# Patient Record
Sex: Male | Born: 1995
Health system: Southern US, Community
[De-identification: ages and names within clinical notes are randomized; demographics above are authoritative.]

## PROBLEM LIST (undated history)

## (undated) ENCOUNTER — Emergency Department (HOSPITAL_COMMUNITY): Discharge status: Absent without leave | Payer: Self-pay

## (undated) DIAGNOSIS — J309 Allergic rhinitis, unspecified: Secondary | ICD-10-CM

## (undated) HISTORY — PX: NO PAST SURGERIES: SHX2092

## (undated) HISTORY — DX: Allergic rhinitis, unspecified: J30.9

---
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
## 2019-06-18 DIAGNOSIS — Z23 Encounter for immunization: Secondary | ICD-10-CM | POA: Diagnosis not present

## 2020-01-10 DIAGNOSIS — Z20828 Contact with and (suspected) exposure to other viral communicable diseases: Secondary | ICD-10-CM | POA: Diagnosis not present

## 2020-01-14 DIAGNOSIS — J069 Acute upper respiratory infection, unspecified: Secondary | ICD-10-CM | POA: Diagnosis not present

## 2020-03-24 DIAGNOSIS — Z20822 Contact with and (suspected) exposure to covid-19: Secondary | ICD-10-CM | POA: Diagnosis not present

## 2020-09-13 DIAGNOSIS — S61411A Laceration without foreign body of right hand, initial encounter: Secondary | ICD-10-CM | POA: Diagnosis not present

## 2020-09-13 DIAGNOSIS — W25XXXA Contact with sharp glass, initial encounter: Secondary | ICD-10-CM | POA: Diagnosis not present

## 2020-09-29 ENCOUNTER — Telehealth: Payer: Self-pay

## 2020-09-29 DIAGNOSIS — Z4802 Encounter for removal of sutures: Secondary | ICD-10-CM | POA: Diagnosis not present

## 2020-09-29 NOTE — Telephone Encounter (Signed)
Caller Adren Dollins  Call Back @ [phone]  Patient is requesting to re-establish care with you.   Please Advise

## 2020-09-29 NOTE — Telephone Encounter (Signed)
Yes, thank you.

## 2020-10-06 ENCOUNTER — Other Ambulatory Visit: Payer: Self-pay

## 2020-10-06 ENCOUNTER — Encounter: Payer: Self-pay | Admitting: Internal Medicine

## 2020-10-06 ENCOUNTER — Ambulatory Visit (INDEPENDENT_AMBULATORY_CARE_PROVIDER_SITE_OTHER): Payer: BC Managed Care – PPO | Admitting: Internal Medicine

## 2020-10-06 VITALS — BP 132/76 | HR 80 | Temp 97.8°F | Resp 16 | Ht 67.0 in | Wt 156.0 lb

## 2020-10-06 DIAGNOSIS — Z1159 Encounter for screening for other viral diseases: Secondary | ICD-10-CM | POA: Diagnosis not present

## 2020-10-06 DIAGNOSIS — Z114 Encounter for screening for human immunodeficiency virus [HIV]: Secondary | ICD-10-CM | POA: Diagnosis not present

## 2020-10-06 DIAGNOSIS — Z0000 Encounter for general adult medical examination without abnormal findings: Secondary | ICD-10-CM | POA: Diagnosis not present

## 2020-10-06 NOTE — Progress Notes (Signed)
° °  Subjective:    Patient ID: Tommy Wilson, male    DOB: 1995/09/16, 25 y.o.   MRN: [id number]  DOS:  10/06/2020 Type of visit - description: New patient, CPX Last seen was many years ago. Since then he is doing well. He did report a few weeks ago, he was drinking beer (multiple beers) on a empty stomach. He felt somewhat dizzy, got up and lost consciousness for about 10 seconds. Did fall on the floor, the next day he had some neck pain but no headache.  Neck pain is largely resolved. Currently feeling great and has no symptoms.    Review of Systems  Other than above, a 14 point review of systems is negative      Past Medical History:  Diagnosis Date   Allergic rhinitis     Past Surgical History:  Procedure Laterality Date   NO PAST SURGERIES      Allergies as of 10/06/2020      Reactions   Penicillins       Medication List    as of October 06, 2020 11:59 PM   You have not been prescribed any medications.        Objective:   Physical Exam BP 132/76 (BP Location: Left Arm, Patient Position: Sitting, Cuff Size: Small)    Pulse 80    Temp 97.8 F (36.6 C) (Oral)    Resp 16    Ht 5\' 7"  (1.702 m)    Wt 156 lb (70.8 kg)    SpO2 97%    BMI 24.43 kg/m  General: Well developed, NAD, BMI noted Neck: No  thyromegaly  HEENT:  Normocephalic . Face symmetric, atraumatic Lungs:  CTA B Normal respiratory effort, no intercostal retractions, no accessory muscle use. Heart: RRR,  no murmur.  Abdomen:  Not distended, soft, non-tender. No rebound or rigidity.   Lower extremities: no pretibial edema bilaterally  Skin: Exposed areas without rash. Not pale. Not jaundice Neurologic:  alert & oriented X3.  Speech normal, gait appropriate for age and unassisted Strength symmetric and appropriate for age.  Psych: Cognition and judgment appear intact.  Cooperative with normal attention span and concentration.  Behavior appropriate. No anxious or depressed appearing.      Assessment    Assessment: Healthy  Plan: Here to reestablish, CPX. BP noted to be elevated upon arrival: Recheck 132/76, recommend to check every 3 months. RTC 1 year      This visit occurred during the SARS-CoV-2 public health emergency.  Safety protocols were in place, including screening questions prior to the visit, additional usage of staff PPE, and extensive cleaning of exam room while observing appropriate contact time as indicated for disinfecting solutions.

## 2020-10-06 NOTE — Patient Instructions (Addendum)
Check the  blood pressure every 3 months BP GOAL is between 110/65 and  135/85. If it is consistently higher or lower, let me know   Please bring all the immunization records that you have  GO TO THE LAB : Get the blood work     GO TO THE FRONT DESK, PLEASE SCHEDULE YOUR APPOINTMENTS Come back for another physical exam in 1 year   Safe Sex Practicing safe sex means taking steps before and during sex to reduce your risk of:  Getting an STI (sexually transmitted infection).  Giving your partner an STI.  Unwanted or unplanned pregnancy. How to practice safe sex Ways you can practice safe sex  Limit your sexual partners to only one partner who is having sex with only you.  Avoid using alcohol and drugs before having sex. Alcohol and drugs can affect your judgment.  Before having sex with a new partner: ? Talk to your partner about past partners, past STIs, and drug use. ? Get screened for STIs and discuss the results with your partner. Ask your partner to get screened too.  Check your body regularly for sores, blisters, rashes, or unusual discharge. If you notice any of these problems, visit your health care provider.  Avoid sexual contact if you have symptoms of an infection or you are being treated for an STI.  While having sex, use a condom. Make sure to: ? Use a condom every time you have vaginal, oral, or anal sex. Both females and males should wear condoms during oral sex. ? Keep condoms in place from the beginning to the end of sexual activity. ? Use a latex condom, if possible. Latex condoms offer the best protection. ? Use only water-based lubricants with a condom. Using petroleum-based lubricants or oils will weaken the condom and increase the chance that it will break.   Ways your health care provider can help you practice safe sex  See your health care provider for regular screenings, exams, and tests for STIs.  Talk with your health care provider about what kind  of birth control (contraception) is best for you.  Get vaccinated against hepatitis B and human papillomavirus (HPV).  If you are at risk of being infected with HIV (human immunodeficiency virus), talk with your health care provider about taking a prescription medicine to prevent HIV infection. You are at risk for HIV if you: ? Are a man who has sex with other men. ? Are sexually active with more than one partner. ? Take drugs by injection. ? Have a sex partner who has HIV. ? Have unprotected sex. ? Have sex with someone who has sex with both men and women. ? Have had an STI.   Follow these instructions at home:  Take over-the-counter and prescription medicines only as told by your health care provider.  Keep all follow-up visits. This is important. Where to find more information  Centers for Disease Control and Prevention: FootballExhibition.com.br  Planned Parenthood: www.plannedparenthood.org  Office on [employer] Health: http://hoffman.com/ Summary  Practicing safe sex means taking steps before and during sex to reduce your risk getting an STI, giving your partner an STI, and having an unwanted or unplanned pregnancy.  Before having sex with a new partner, talk to your partner about past partners, past STIs, and drug use.  Use a condom every time you have vaginal, oral, or anal sex. Both females and males should wear condoms during oral sex.  Check your body regularly for sores, blisters, rashes, or  unusual discharge. If you notice any of these problems, visit your health care provider.  See your health care provider for regular screenings, exams, and tests for STIs. This information is not intended to replace advice given to you by your health care provider. Make sure you discuss any questions you have with your health care provider. Document Revised: 12/31/2019 Document Reviewed: 12/31/2019 Elsevier Patient Education  2021 Elsevier Inc.  Testicular Self-Exam A self-examination of your  testicles (testicular self-exam) involves looking at and feeling your testicles for abnormal lumps or swelling. Several things can cause swelling, lumps, or pain in your testicles. Some of these causes are:  Injuries.  Inflammation.  Infection.  Buildup of fluids around the testicle (hydrocele).  Twisted testicles (testicular torsion).  Testicular cancer. You may be at risk for testicular cancer if you have: ? An undescended testicle (cryptorchidism). ? A history of previous testicular cancer. ? A family history of testicular cancer. General tips and recommendations  The testicles are easiest to examine after a warm bath or shower. They are more difficult to examine when you are cold because the muscles attached to the testicles retract and pull them up higher or into the abdomen.  A normal testicle is egg-shaped and feels firm. It is smooth and not tender.  It is normal to feel a firm, spaghetti-like cord at the back of your testicle. This is the spermatic cord. How to do a testicular self-exam 1. Stand and hold your penis away from your body. 2. Look at each testicle to check for changes in appearance, such as swelling or changes in size or shape. 3. Roll each testicle between your thumb and forefinger, feeling the entire testicle. Feel for: ? Lumps. ? Swelling. ? Discomfort. 4. Check the groin area between your abdomen and upper thighs on both sides of your body. Look and feel for any swelling or bumps that are tender. These could be enlarged lymph nodes.   Contact a health care provider if:  You find any bumps or lumps, such as a small, hard, pea-sized lump.  You find swelling, pain, or soreness.  You see or feel any other changes in your testicles. Summary  A self-examination of your testicles (testicular self-exam) involves looking at and feeling your testicles for any changes.  Check each of your testicles for lumps, swelling, or discomfort. These changes can be  caused by many things.  Check for swelling or tender bumps in your groin area between your lower abdomen and upper thighs. This information is not intended to replace advice given to you by your health care provider. Make sure you discuss any questions you have with your health care provider. Document Revised: 07/02/2019 Document Reviewed: 07/02/2019 Elsevier Patient Education  2021 [employer].

## 2020-10-06 NOTE — Progress Notes (Signed)
Pre visit review using our clinic review tool, if applicable. No additional management support is needed unless otherwise documented below in the visit note. 

## 2020-10-07 ENCOUNTER — Encounter: Payer: Self-pay | Admitting: Internal Medicine

## 2020-10-07 LAB — CBC WITH DIFFERENTIAL/PLATELET
Basophils Absolute: 0 10*3/uL (ref 0.0–0.1)
Basophils Relative: 0.4 % (ref 0.0–3.0)
Eosinophils Absolute: 0.3 10*3/uL (ref 0.0–0.7)
Eosinophils Relative: 3.7 % (ref 0.0–5.0)
HCT: 41.1 % (ref 39.0–52.0)
Hemoglobin: 14.3 g/dL (ref 13.0–17.0)
Lymphocytes Relative: 34.4 % (ref 12.0–46.0)
Lymphs Abs: 2.4 10*3/uL (ref 0.7–4.0)
MCHC: 34.8 g/dL (ref 30.0–36.0)
MCV: 89.4 fl (ref 78.0–100.0)
Monocytes Absolute: 0.5 10*3/uL (ref 0.1–1.0)
Monocytes Relative: 6.6 % (ref 3.0–12.0)
Neutro Abs: 3.9 10*3/uL (ref 1.4–7.7)
Neutrophils Relative %: 54.9 % (ref 43.0–77.0)
Platelets: 164 10*3/uL (ref 150.0–400.0)
RBC: 4.6 Mil/uL (ref 4.22–5.81)
RDW: 13.3 % (ref 11.5–15.5)
WBC: 7.1 10*3/uL (ref 4.0–10.5)

## 2020-10-07 LAB — LIPID PANEL
Cholesterol: 145 mg/dL (ref 0–200)
HDL: 51.3 mg/dL (ref >39.00)
LDL Cholesterol: 54 mg/dL (ref 0–99)
NonHDL: 94.09
Total CHOL/HDL Ratio: 3
Triglycerides: 200 mg/dL — ABNORMAL HIGH (ref 0.0–149.0)
VLDL: 40 mg/dL (ref 0.0–40.0)

## 2020-10-07 LAB — COMPREHENSIVE METABOLIC PANEL
ALT: 17 U/L (ref 0–53)
AST: 19 U/L (ref 0–37)
Albumin: 4.5 g/dL (ref 3.5–5.2)
Alkaline Phosphatase: 85 U/L (ref 39–117)
BUN: 18 mg/dL (ref 6–23)
CO2: 29 mEq/L (ref 19–32)
Calcium: 9.4 mg/dL (ref 8.4–10.5)
Chloride: 104 mEq/L (ref 96–112)
Creatinine, Ser: 0.78 mg/dL (ref 0.40–1.50)
GFR: 124.58 mL/min (ref >60.00)
Glucose, Bld: 88 mg/dL (ref 70–99)
Potassium: 3.9 mEq/L (ref 3.5–5.1)
Sodium: 139 mEq/L (ref 135–145)
Total Bilirubin: 0.6 mg/dL (ref 0.2–1.2)
Total Protein: 7.3 g/dL (ref 6.0–8.3)

## 2020-10-07 LAB — TSH: TSH: 0.79 u[IU]/mL (ref 0.35–4.50)

## 2020-10-07 LAB — HIV ANTIBODY (ROUTINE TESTING W REFLEX): HIV 1&2 Ab, 4th Generation: NONREACTIVE

## 2020-10-07 LAB — HEPATITIS C ANTIBODY
Hepatitis C Ab: NONREACTIVE
SIGNAL TO CUT-OFF: 0.01 (ref <1.00)

## 2020-10-07 NOTE — Assessment & Plan Note (Signed)
Vaccines: Moved to the [place] at age 25 months, he got all  childhood shots (per patient), also had a number of shots last year (related to a immigration process).  Asked to bring records.  Reports she had a flu shot, encouraged to get his COVID vaccines. Labs: CMP, FLP, CBC, TSH, HIV, hep C Patient education: -Diet exercise -Safe sex -Self testicular exam -Risk of binge drinking, strongly encouraged moderation.

## 2020-10-14 ENCOUNTER — Encounter: Payer: Self-pay | Admitting: Internal Medicine

## 2020-10-14 ENCOUNTER — Other Ambulatory Visit: Payer: Self-pay

## 2020-10-14 ENCOUNTER — Observation Stay (HOSPITAL_COMMUNITY)
Admission: EM | Admit: 2020-10-14 | Discharge: 2020-10-15 | Disposition: A | Discharge status: Home / Self Care | Payer: BC Managed Care – PPO | Attending: Otolaryngology | Admitting: Otolaryngology

## 2020-10-14 ENCOUNTER — Telehealth (INDEPENDENT_AMBULATORY_CARE_PROVIDER_SITE_OTHER): Payer: BC Managed Care – PPO | Admitting: Internal Medicine

## 2020-10-14 ENCOUNTER — Emergency Department (HOSPITAL_COMMUNITY): Payer: BC Managed Care – PPO

## 2020-10-14 VITALS — Ht 67.0 in | Wt 156.0 lb

## 2020-10-14 DIAGNOSIS — Z20822 Contact with and (suspected) exposure to covid-19: Secondary | ICD-10-CM | POA: Insufficient documentation

## 2020-10-14 DIAGNOSIS — J029 Acute pharyngitis, unspecified: Secondary | ICD-10-CM | POA: Diagnosis not present

## 2020-10-14 DIAGNOSIS — J36 Peritonsillar abscess: Secondary | ICD-10-CM | POA: Diagnosis not present

## 2020-10-14 DIAGNOSIS — R590 Localized enlarged lymph nodes: Secondary | ICD-10-CM | POA: Diagnosis not present

## 2020-10-14 DIAGNOSIS — F17210 Nicotine dependence, cigarettes, uncomplicated: Secondary | ICD-10-CM | POA: Diagnosis not present

## 2020-10-14 DIAGNOSIS — R1310 Dysphagia, unspecified: Secondary | ICD-10-CM | POA: Diagnosis not present

## 2020-10-14 DIAGNOSIS — J392 Other diseases of pharynx: Secondary | ICD-10-CM | POA: Diagnosis not present

## 2020-10-14 LAB — CBC WITH DIFFERENTIAL/PLATELET
Abs Immature Granulocytes: 0.06 10*3/uL (ref 0.00–0.07)
Basophils Absolute: 0 10*3/uL (ref 0.0–0.1)
Basophils Relative: 0 %
Eosinophils Absolute: 0.1 10*3/uL (ref 0.0–0.5)
Eosinophils Relative: 0 %
HCT: 43.8 % (ref 39.0–52.0)
Hemoglobin: 15.1 g/dL (ref 13.0–17.0)
Immature Granulocytes: 0 %
Lymphocytes Relative: 10 %
Lymphs Abs: 1.5 10*3/uL (ref 0.7–4.0)
MCH: 30.9 pg (ref 26.0–34.0)
MCHC: 34.5 g/dL (ref 30.0–36.0)
MCV: 89.8 fL (ref 80.0–100.0)
Monocytes Absolute: 1.2 10*3/uL — ABNORMAL HIGH (ref 0.1–1.0)
Monocytes Relative: 8 %
Neutro Abs: 12.1 10*3/uL — ABNORMAL HIGH (ref 1.7–7.7)
Neutrophils Relative %: 82 %
Platelets: 144 10*3/uL — ABNORMAL LOW (ref 150–400)
RBC: 4.88 MIL/uL (ref 4.22–5.81)
RDW: 13 % (ref 11.5–15.5)
WBC: 15 10*3/uL — ABNORMAL HIGH (ref 4.0–10.5)
nRBC: 0 % (ref 0.0–0.2)

## 2020-10-14 LAB — GROUP A STREP BY PCR: Group A Strep by PCR: NOT DETECTED

## 2020-10-14 LAB — I-STAT CHEM 8, ED
BUN: 10 mg/dL (ref 6–20)
Calcium, Ion: 1.14 mmol/L — ABNORMAL LOW (ref 1.15–1.40)
Chloride: 105 mmol/L (ref 98–111)
Creatinine, Ser: 0.6 mg/dL — ABNORMAL LOW (ref 0.61–1.24)
Glucose, Bld: 96 mg/dL (ref 70–99)
HCT: 44 % (ref 39.0–52.0)
Hemoglobin: 15 g/dL (ref 13.0–17.0)
Potassium: 3.8 mmol/L (ref 3.5–5.1)
Sodium: 139 mmol/L (ref 135–145)
TCO2: 22 mmol/L (ref 22–32)

## 2020-10-14 MED ORDER — SODIUM CHLORIDE 0.9 % IV BOLUS
1000.0000 mL | Freq: Once | INTRAVENOUS | Status: AC
  Administered 2020-10-14: 1000 mL via INTRAVENOUS

## 2020-10-14 MED ORDER — IOHEXOL 300 MG/ML  SOLN
75.0000 mL | Freq: Once | INTRAMUSCULAR | Status: AC | PRN
  Administered 2020-10-14: 75 mL via INTRAVENOUS

## 2020-10-14 MED ORDER — LIDOCAINE HCL (PF) 1 % IJ SOLN
10.0000 mL | Freq: Once | INTRAMUSCULAR | Status: DC
  Filled 2020-10-14: qty 10

## 2020-10-14 MED ORDER — KETOROLAC TROMETHAMINE 30 MG/ML IJ SOLN
30.0000 mg | Freq: Once | INTRAMUSCULAR | Status: AC
  Administered 2020-10-14: 30 mg via INTRAVENOUS
  Filled 2020-10-14: qty 1

## 2020-10-14 MED ORDER — CLINDAMYCIN PHOSPHATE 600 MG/50ML IV SOLN
600.0000 mg | Freq: Once | INTRAVENOUS | Status: AC
  Administered 2020-10-14: 600 mg via INTRAVENOUS
  Filled 2020-10-14: qty 50

## 2020-10-14 MED ORDER — DEXAMETHASONE SODIUM PHOSPHATE 10 MG/ML IJ SOLN
10.0000 mg | Freq: Once | INTRAMUSCULAR | Status: AC
  Administered 2020-10-14: 10 mg via INTRAVENOUS
  Filled 2020-10-14: qty 1

## 2020-10-14 NOTE — ED Triage Notes (Signed)
Patient c/o sore throat and difficulty swallowing. Stated he has an appt tomorrow for wisdom tooth removal x4 tomorrow and the pain started from there.

## 2020-10-14 NOTE — Discharge Instructions (Addendum)
You can take Tylenol or Ibuprofen as directed for pain. You can alternate Tylenol and Ibuprofen every 4 hours. If you take Tylenol at 1pm, then you can take Ibuprofen at 5pm. Then you can take Tylenol again at 9pm.   Take pain medications as directed for break through pain. Do not drive or operate machinery while taking this medication.   Take antibiotics clindamycin 300 mg 3 times a day for the 10 days as directed. Please take all of your antibiotics until finished.  Follow-up with referred ear nose and throat doctor within 7 to 10 days sooner if any worsening of symptoms..  Return emergency department for any worsening pain, inability to swallow, difficulty breathing, vomiting or any other worsening concerning symptoms.

## 2020-10-14 NOTE — ED Notes (Signed)
ENT Provider at bedside 

## 2020-10-14 NOTE — Patient Instructions (Signed)
Go to the ER at [facility] now I already spoke with the doctor there

## 2020-10-14 NOTE — ED Notes (Signed)
Patient has pain with swallowing but is able to swallow.

## 2020-10-14 NOTE — Consult Note (Signed)
Reason for Consult: Peritonsillar abscess Referring Physician: ER  Tommy Wilson is an 25 y.o. male.  HPI: 4-day history of sore throat.  He has had intermittent left molar pain.  Many times when he has had the pain in his molar the throat has given him some difficulty.  He was due to get his wisdom teeth out.  His symptoms this time with his throat started on Saturday and he became more swollen in his submandibular area and pain to swallow.  He went to the dentist on Monday and got some clindamycin and started that Monday afternoon.  He is somewhat better.  His CT scan showed a abscess cavity in the inferior portion of the tonsil or soft tissue just superior and lateral to the epiglottis.  This appears to have edematous changes in the retropharynx and it does extend to the medial aspect of the left mandible.  He is fairly convinced this is a tooth problem.  Past Medical History:  Diagnosis Date  . Allergic rhinitis     Past Surgical History:  Procedure Laterality Date  . NO PAST SURGERIES      Family History  Problem Relation Age of Onset  . Hypertension Father   . CAD Neg Hx   . Stroke Neg Hx   . Diabetes Neg Hx   . Cancer Neg Hx     Social History:  reports that he has been smoking e-cigarettes. He has never used smokeless tobacco. He reports current alcohol use. He reports that he does not use drugs.  Allergies:  Allergies  Allergen Reactions  . Penicillins     Medications: I have reviewed the patient's current medications.  Results for orders placed or performed during the hospital encounter of 10/14/20 (from the past 48 hour(s))  Group A Strep by PCR     Status: None   Collection Time: 10/14/20  6:22 PM   Specimen: Throat; Sterile Swab  Result Value Ref Range   Group A Strep by PCR NOT DETECTED NOT DETECTED    Comment: Performed at [facility], [address], [place], [place] [address]  CBC with Differential     Status: Abnormal   Collection Time: 10/14/20   6:28 PM  Result Value Ref Range   WBC 15.0 (H) 4.0 - 10.5 K/uL   RBC 4.88 4.22 - 5.81 MIL/uL   Hemoglobin 15.1 13.0 - 17.0 g/dL   HCT 93.2 67.1 - 24.5 %   MCV 89.8 80.0 - 100.0 fL   MCH 30.9 26.0 - 34.0 pg   MCHC 34.5 30.0 - 36.0 g/dL   RDW 80.9 98.3 - 38.2 %   Platelets 144 (L) 150 - 400 K/uL   nRBC 0.0 0.0 - 0.2 %   Neutrophils Relative % 82 %   Neutro Abs 12.1 (H) 1.7 - 7.7 K/uL   Lymphocytes Relative 10 %   Lymphs Abs 1.5 0.7 - 4.0 K/uL   Monocytes Relative 8 %   Monocytes Absolute 1.2 (H) 0.1 - 1.0 K/uL   Eosinophils Relative 0 %   Eosinophils Absolute 0.1 0.0 - 0.5 K/uL   Basophils Relative 0 %   Basophils Absolute 0.0 0.0 - 0.1 K/uL   Immature Granulocytes 0 %   Abs Immature Granulocytes 0.06 0.00 - 0.07 K/uL    Comment: Performed at [facility], [address], [place], [place] [address]  I-stat chem 8, ED (not at [facility] or [facility])     Status: Abnormal   Collection Time:  10/14/20  6:59 PM  Result Value Ref Range   Sodium 139 135 - 145 mmol/L   Potassium 3.8 3.5 - 5.1 mmol/L   Chloride 105 98 - 111 mmol/L   BUN 10 6 - 20 mg/dL   Creatinine, Ser 1.85 (L) 0.61 - 1.24 mg/dL   Glucose, Bld 96 70 - 99 mg/dL    Comment: Glucose reference range applies only to samples taken after fasting for at least 8 hours.   Calcium, Ion 1.14 (L) 1.15 - 1.40 mmol/L   TCO2 22 22 - 32 mmol/L   Hemoglobin 15.0 13.0 - 17.0 g/dL   HCT 63.1 49.7 - 02.6 %    CT Soft Tissue Neck W Contrast  Result Date: 10/14/2020 CLINICAL DATA:  Difficulty swallowing with sore throat EXAM: CT NECK WITH CONTRAST TECHNIQUE: Multidetector CT imaging of the neck was performed using the standard protocol following the bolus administration of intravenous contrast. CONTRAST:  67mL OMNIPAQUE IOHEXOL 300 MG/ML  SOLN COMPARISON:  None. FINDINGS: PHARYNX AND LARYNX: There is a left palatine peritonsillar abscess measuring 1.4 x 0.9 cm. There is a large retropharyngeal collection measuring 5 mm in thickness and extending  from C1-C5. The collection tapers superiorly and inferiorly and is asymmetric to the right SALIVARY GLANDS: Normal parotid, submandibular and sublingual glands. THYROID: Normal. LYMPH NODES: Multiple bilateral subcentimeter lymph nodes. There is an enlarged left level 2A node that measures 12 mm. VASCULAR: Major cervical vessels are patent. LIMITED INTRACRANIAL: Normal. VISUALIZED ORBITS: Normal. MASTOIDS AND VISUALIZED PARANASAL SINUSES: No fluid levels or advanced mucosal thickening. No mastoid effusion. SKELETON: No bony spinal canal stenosis. No lytic or blastic lesions. UPPER CHEST: Clear. OTHER: None. IMPRESSION: 1. Left palatine peritonsillar abscess measuring 1.4 x 0.9 cm with retropharyngeal edema. 2. Reactive cervical lymphadenopathy. Electronically Signed   By: Deatra Robinson M.D.   On: 10/14/2020 21:29    ROS Blood pressure 126/74, pulse 92, temperature 99.3 F (37.4 C), resp. rate 17, SpO2 97 %. Physical Exam Constitutional:      Appearance: He is well-developed.  HENT:     Head:     Comments: He is without any distress and normal voice.  Opens his mouth normally.  He does not have any evidence of drooling or difficulty swallowing.  His nose is clear.  Oral cavity/oropharynx-the dentition do not have any apparent irritation or infection and palpably along the medial mandible there is no swelling or pain.  The tonsil on the left side is slightly larger than the right but does not have the appearance of a peritonsillar abscess.  He sticks his tongue out normally.  I cannot appreciate the swelling of the retropharynx. Musculoskeletal:     Cervical back: Normal range of motion and neck supple.  Neurological:     Mental Status: He is alert.       Assessment/Plan: Left pharyngeal abscess-this could be a low peritonsillar abscess but it is almost outside the region it so low.  Without the CAT scan his exam would not indicate he had any issue relative to a abscess.  This possibly could be a  dental origin as there is some edematous tracking from the medial mandible but for now he has a collection of fluid that is not amendable to in the emergency room drainage and I think with the size of this a medical therapy would be appropriate.  I am going to start him on intravenous antibiotics and admission.  We will judge whether he is improving enough to not  need a surgical intervention.  He says he is allergic to penicillin but he does not know what the reaction was Suzanna Obey 10/14/2020, 11:44 PM

## 2020-10-14 NOTE — ED Provider Notes (Signed)
[facility] EMERGENCY DEPARTMENT Provider Note   CSN: [id number] Arrival date & time: 10/14/20  1716     History Chief Complaint  Patient presents with  . Sore Throat    Tommy Wilson is a 25 y.o. male past medical history of allergic rhinitis who presents for evaluation of sore throat that is been ongoing for 2 days.  He states that he is scheduled to get his wisdom teeth taken out tomorrow and he thought that is what was causing his symptoms.  He is on clindamycin which she has been taking.  He states that the pain in his throat was getting worse, prompting a virtual visit with his PCP.  His PCP sent him to the ED for further evaluation.  He states that it hurts more when he swallows but he is able to tolerate his secretions, liquids.  He has not tried to eat any solids.  He has not had any difficulty breathing, vomiting.  He has not noted any fever.  He states that he is supposed to get his vision be taken out tomorrow and that is why he has been on the clindamycin.  The history is provided by the patient.       Past Medical History:  Diagnosis Date  . Allergic rhinitis     Patient Active Problem List   Diagnosis Date Noted  . Tonsil, abscess 10/14/2020  . Annual physical exam 11/28/2013  . ABNORMAL THYROID FUNCTION TESTS 11/21/2009  . ALLERGY, ENVIRONMENTAL 11/21/2009    Past Surgical History:  Procedure Laterality Date  . NO PAST SURGERIES         Family History  Problem Relation Age of Onset  . Hypertension Father   . CAD Neg Hx   . Stroke Neg Hx   . Diabetes Neg Hx   . Cancer Neg Hx     Social History   Tobacco Use  . Smoking status: Current Every Day Smoker    Types: E-cigarettes  . Smokeless tobacco: Never Used  Substance Use Topics  . Alcohol use: Yes    Comment: weekends   . Drug use: No    Home Medications Prior to Admission medications   Medication Sig Start Date End Date Taking? Authorizing Provider  chlorhexidine  (PERIDEX) 0.12 % solution by Mouth Rinse route. 10/13/20  Yes [provider]  clindamycin (CLEOCIN) 150 MG capsule Take 150 mg by mouth See admin instructions. Qid x 7 days 10/13/20  Yes [provider]  ibuprofen (ADVIL) 600 MG tablet Take 600 mg by mouth 3 (three) times daily. 10/13/20  Yes [provider]    Allergies    Penicillins  Review of Systems   Review of Systems  HENT: Positive for sore throat and trouble swallowing.   Respiratory: Negative for shortness of breath.   Gastrointestinal: Negative for vomiting.  All other systems reviewed and are negative.   Physical Exam Updated Vital Signs BP 126/74   Pulse 92   Temp 99.3 F (37.4 C)   Resp 17   SpO2 97%   Physical Exam Vitals and nursing note reviewed.  Constitutional:      Appearance: He is well-developed and well-nourished.  HENT:     Head: Normocephalic and atraumatic.     Mouth/Throat:     Comments: Slightly horse phonation.  Posterior pharynx is erythematous.  There is some slight edema noted to the left tonsillar area extending distally.  Uvula is very minimally deviated.  No swelling in floor of  mouth.  No oral angioedema.  Eyes:     General: No scleral icterus.       Right eye: No discharge.        Left eye: No discharge.     Extraocular Movements: EOM normal.     Conjunctiva/sclera: Conjunctivae normal.  Pulmonary:     Effort: Pulmonary effort is normal.     Comments: Lungs clear to auscultation bilaterally.  Symmetric chest rise.  No wheezing, rales, rhonchi. Lymphadenopathy:     Cervical: Cervical adenopathy present.  Skin:    General: Skin is warm and dry.  Neurological:     Mental Status: He is alert.  Psychiatric:        Mood and Affect: Mood and affect normal.        Speech: Speech normal.        Behavior: Behavior normal.     ED Results / Procedures / Treatments   Labs (all labs ordered are listed, but only abnormal results are displayed) Labs Reviewed  CBC  WITH DIFFERENTIAL/PLATELET - Abnormal; Notable for the following components:      Result Value   WBC 15.0 (*)    Platelets 144 (*)    Neutro Abs 12.1 (*)    Monocytes Absolute 1.2 (*)    All other components within normal limits  I-STAT CHEM 8, ED - Abnormal; Notable for the following components:   Creatinine, Ser 0.60 (*)    Calcium, Ion 1.14 (*)    All other components within normal limits  GROUP A STREP BY PCR  POC SARS CORONAVIRUS 2 AG -  ED    EKG None  Radiology CT Soft Tissue Neck W Contrast  Result Date: 10/14/2020 CLINICAL DATA:  Difficulty swallowing with sore throat EXAM: CT NECK WITH CONTRAST TECHNIQUE: Multidetector CT imaging of the neck was performed using the standard protocol following the bolus administration of intravenous contrast. CONTRAST:  40mL OMNIPAQUE IOHEXOL 300 MG/ML  SOLN COMPARISON:  None. FINDINGS: PHARYNX AND LARYNX: There is a left palatine peritonsillar abscess measuring 1.4 x 0.9 cm. There is a large retropharyngeal collection measuring 5 mm in thickness and extending from C1-C5. The collection tapers superiorly and inferiorly and is asymmetric to the right SALIVARY GLANDS: Normal parotid, submandibular and sublingual glands. THYROID: Normal. LYMPH NODES: Multiple bilateral subcentimeter lymph nodes. There is an enlarged left level 2A node that measures 12 mm. VASCULAR: Major cervical vessels are patent. LIMITED INTRACRANIAL: Normal. VISUALIZED ORBITS: Normal. MASTOIDS AND VISUALIZED PARANASAL SINUSES: No fluid levels or advanced mucosal thickening. No mastoid effusion. SKELETON: No bony spinal canal stenosis. No lytic or blastic lesions. UPPER CHEST: Clear. OTHER: None. IMPRESSION: 1. Left palatine peritonsillar abscess measuring 1.4 x 0.9 cm with retropharyngeal edema. 2. Reactive cervical lymphadenopathy. Electronically Signed   By: Deatra Robinson M.D.   On: 10/14/2020 21:29    Procedures Procedures   Medications Ordered in ED Medications  lidocaine  (PF) (XYLOCAINE) 1 % injection 10 mL (has no administration in time range)  sodium chloride 0.9 % bolus 1,000 mL (0 mLs Intravenous Stopped 10/14/20 2034)  dexamethasone (DECADRON) injection 10 mg (10 mg Intravenous Given 10/14/20 1847)  ketorolac (TORADOL) 30 MG/ML injection 30 mg (30 mg Intravenous Given 10/14/20 1848)  iohexol (OMNIPAQUE) 300 MG/ML solution 75 mL (75 mLs Intravenous Contrast Given 10/14/20 2111)  clindamycin (CLEOCIN) IVPB 600 mg (0 mg Intravenous Stopped 10/14/20 2331)    ED Course  I have reviewed the triage vital signs and the nursing notes.  Pertinent labs &  imaging results that were available during my care of the patient were reviewed by me and considered in my medical decision making (see chart for details).    MDM Rules/Calculators/A&P                          25 year old male who presents for evaluation of 2 to 3 days of sore throat.  Reports painful swallowing.  He has been able tolerate his secretions but does report pain with doing so.  He states he is on clindamycin because he is scheduled to get his wisdom teeth taken out tomorrow and the risk and some concerns about some possible infection.  On initial arrival, he is afebrile nontoxic-appearing.  Vital signs are stable.  On exam, he does have some hoarse phonation.  Airways patent.  Uvula slightly deviated and he has some asymmetrical tonsillar swelling and on the left.  He does have some cervical lymphadenopathy.  We will plan to check labs, strep.  CBC shows leukocytosis of 15.  Chem-8 is unremarkable.  Strep is negative.  CT soft tissue neck shows evidence of peritonsillar abscess on the left measuring 1.4 x 1 cm with retropharyngeal edema.  There is some reactive some cervical lymphadenopathy.  Reassessment.  Patient does report some improvement in pain after analgesics here in the ED.  He states he feels like he is able to swallow better.  Discussed with Dr. Jearld Fenton (ENT).  I did discuss with him regarding the  retropharyngeal edema as well as the fact the patient has already been on clindamycin and this occurred on antibiotics.  Will discuss with patient for further management.  Patient states he would rather have ENT come and drain the abscess tonight.  I notified Dr. Jearld Fenton and he is agreeable and will come in.  Discussed patient with Dr. Jearld Fenton (ENT) after evaluation in the ED. He will plan to admit the patient.   Portions of this note were generated with Scientist, clinical (histocompatibility and immunogenetics). Dictation errors may occur despite best attempts at proofreading.   Final Clinical Impression(s) / ED Diagnoses Final diagnoses:  Peritonsillar abscess    Rx / DC Orders ED Discharge Orders    None       Rosana Hoes 10/14/20 2359    Tilden Fossa, MD 10/15/20 1437

## 2020-10-14 NOTE — Progress Notes (Signed)
° °  Subjective:    Patient ID: Tommy Wilson, male    DOB: [birth year], 25 y.o.   MRN: [id number]  DOS:  10/14/2020 Type of visit - description: Virtual visit, converted to in person visit  Symptoms started approximately 3 to 4 days ago with sore throat but at the same time dental pain. Was seen emergently yesterday by a dentist, was prescribed clindamycin and ibuprofen, is scheduled to have wisdom tooth surgery tomorrow at 8 AM.  he is calling because the sore throat is increasingly worse. Has pain and some difficulty swallowing.  Denies any fever chills No sinus congestion No drooling No difficulty breathing No cough, nausea, vomiting.     Review of Systems See above   Past Medical History:  Diagnosis Date   Allergic rhinitis     Past Surgical History:  Procedure Laterality Date   NO PAST SURGERIES      Allergies as of 10/14/2020      Reactions   Penicillins       Medication List       Accurate as of October 14, 2020  4:50 PM. If you have any questions, ask your nurse or doctor.        clindamycin 150 MG capsule Commonly known as: CLEOCIN Take 150 mg by mouth 4 (four) times daily.   ibuprofen 600 MG tablet Commonly known as: ADVIL Take 600 mg by mouth 3 (three) times daily.          Objective:   Physical Exam Neck:     Ht 5\' 7"  (1.702 m)    Wt 156 lb (70.8 kg)    BMI 24.43 kg/m    Visit was converted from virtual to in person  General:   Well developed, NAD, BMI noted. HEENT:  Normocephalic . Face symmetric, atraumatic Throat: Left tonsil enlarged, almost getting to the midline. Right tonsil normal Uvula not swollen. Posterior pharyngeal wall flat. Voice slightly muffled No trismus, no drooling. Dental: No obvious dental or gum abscess upon palpation. Neck: No stridor.  Lungs:  CTA B Normal respiratory effort, no intercostal retractions, no accessory muscle use. Heart: RRR,  no murmur.  Lower extremities: no pretibial edema  bilaterally  Skin: Not pale. Not jaundice Neurologic:  alert & oriented X3.  Speech normal, gait appropriate for age and unassisted Psych--  Cognition and judgment appear intact.  Cooperative with normal attention span and concentration.  Behavior appropriate. No anxious or depressed appearing.      Assessment    Assessment: Healthy  Plan: Rule out peritonsillar abscess Sore throat this started 4 days ago, getting worse, exam suggest peritonsillar abscess. Plan: Go immediately to the ER for further evaluation.  Patient understood and the state he is driving from here to: ER. Spoke with the ER MD Dr. who agreed to see the patient. Of note , no evidence of dental abscess, recommend to discuss further steps with dentist once tonsil issues resolve   This visit occurred during the SARS-CoV-2 public health emergency.  Safety protocols were in place, including screening questions prior to the visit, additional usage of staff PPE, and extensive cleaning of exam room while observing appropriate contact time as indicated for disinfecting solutions   This visit occurred during the SARS-CoV-2 public health emergency.  Safety protocols were in place, including screening questions prior to the visit, additional usage of staff PPE, and extensive cleaning of exam room while observing appropriate contact time as indicated for disinfecting solutions.

## 2020-10-15 LAB — POC SARS CORONAVIRUS 2 AG -  ED: SARS Coronavirus 2 Ag: NEGATIVE

## 2020-10-15 MED ORDER — ONDANSETRON HCL 4 MG/2ML IJ SOLN: 4.0000 mg | Freq: Four times a day (QID) | INTRAMUSCULAR | Status: DC | PRN

## 2020-10-15 MED ORDER — CLINDAMYCIN HCL 300 MG PO CAPS: 300.0000 mg | ORAL_CAPSULE | Freq: Three times a day (TID) | ORAL | 0 refills | Status: AC

## 2020-10-15 MED ORDER — HYDROCODONE-ACETAMINOPHEN 5-325 MG PO TABS
1.0000 | ORAL_TABLET | ORAL | Status: DC | PRN
Start: 2020-10-15 — End: 2020-10-15

## 2020-10-15 MED ORDER — DEXTROSE-NACL 5-0.45 % IV SOLN: INTRAVENOUS | Status: DC

## 2020-10-15 MED ORDER — ONDANSETRON 4 MG PO TBDP: 4.0000 mg | ORAL_TABLET | Freq: Four times a day (QID) | ORAL | Status: DC | PRN

## 2020-10-15 MED ORDER — CLINDAMYCIN PHOSPHATE 600 MG/50ML IV SOLN
600.0000 mg | Freq: Four times a day (QID) | INTRAVENOUS | Status: DC
  Administered 2020-10-15 (×2): 600 mg via INTRAVENOUS
  Filled 2020-10-15 (×2): qty 50

## 2020-10-15 NOTE — ED Notes (Signed)
Pt ambulatory to restroom with steady gait.

## 2020-10-15 NOTE — ED Notes (Signed)
Patient declined the need for patient meds at this time.

## 2020-10-15 NOTE — Discharge Summary (Signed)
Physician Discharge Summary  Patient ID: Tommy Wilson MRN: [id number] DOB/AGE: 1996-04-29 24 y.o.  Admit date: 10/14/2020 Discharge date: 10/15/2020  Admission Diagnoses:  Discharge Diagnoses:  Active Problems:   Tonsil, abscess   Discharged Condition: good  Hospital Course: Patient was admitted for a abscess of his pharynx that might be secondary to a tooth origin.  He has been convinced that it is the tooth.  It always starts hurting in the tooth first.  He was started on antibiotics by the dentist with I guess a suspicion of an infection.  He is due to have his wisdom teeth out.  He was admitted overnight to see if conservative therapy would make any difference and he is profoundly better today.  He no longer has any pain of the throat.  He is able to swallow without difficulty.  No breathing trouble.  No change in his voice.  He wants to go home.  Consults: None  Significant Diagnostic Studies: none  Treatments: none  Discharge Exam: Blood pressure 114/71, pulse 79, temperature 99 F (37.2 C), temperature source Oral, resp. rate 20, height 5\' 7"  (1.702 m), weight 66.7 kg, SpO2 100 %. Awake and alert.  No trismus.  No swelling of his oral cavity oropharynx.  Neck is without firmness or swelling.  No tenderness.  There is no stridor.  His voice sounds normal.  Heart is regular.  Lungs are clear.  Extremities no tenderness or swelling.  Disposition:  He will be discharged on clindamycin to follow-up with ENT in 1 week.  He understands that he should return to the hospital immediately if he has any reversal of his symptoms meaning getting worse.  He can follow-up with his oral surgeon regarding the extraction of the teeth.    Follow-up Information    Schedule an appointment as soon as possible for a visit  with [facility].   Contact information: [address] [place] [phone]               Signed: [phone] 10/15/2020, 1:34 PM

## 2020-10-15 NOTE — ED Notes (Signed)
Patient ate bagged meal and taking PO fluids without issue.

## 2020-10-15 NOTE — ED Notes (Signed)
Report attempted 

## 2020-10-15 NOTE — ED Notes (Signed)
Patient verbalizes understanding of discharge instructions. Opportunity for questioning and answers were provided. Armband removed by staff, pt discharged from ED.  

## 2020-11-25 DIAGNOSIS — Z113 Encounter for screening for infections with a predominantly sexual mode of transmission: Secondary | ICD-10-CM | POA: Diagnosis not present

## 2020-12-23 ENCOUNTER — Other Ambulatory Visit: Payer: Self-pay

## 2020-12-23 ENCOUNTER — Ambulatory Visit (INDEPENDENT_AMBULATORY_CARE_PROVIDER_SITE_OTHER): Payer: BC Managed Care – PPO | Admitting: Family

## 2020-12-23 ENCOUNTER — Encounter: Payer: Self-pay | Admitting: Internal Medicine

## 2020-12-23 ENCOUNTER — Encounter: Payer: Self-pay | Admitting: Family

## 2020-12-23 ENCOUNTER — Other Ambulatory Visit (HOSPITAL_COMMUNITY)
Admission: RE | Admit: 2020-12-23 | Discharge: 2020-12-23 | Disposition: A | Discharge status: Home / Self Care | Payer: BC Managed Care – PPO | Source: Ambulatory Visit | Attending: Family | Admitting: Family

## 2020-12-23 VITALS — BP 134/78 | HR 81 | Temp 98.4°F | Resp 16 | Ht 66.0 in | Wt 154.0 lb

## 2020-12-23 DIAGNOSIS — Z113 Encounter for screening for infections with a predominantly sexual mode of transmission: Secondary | ICD-10-CM | POA: Insufficient documentation

## 2020-12-23 DIAGNOSIS — R1032 Left lower quadrant pain: Secondary | ICD-10-CM | POA: Diagnosis not present

## 2020-12-23 NOTE — Progress Notes (Signed)
Subjective:    Patient ID: Tommy Wilson, male    DOB: 02/01/1996, 25 y.o.   MRN: [id number]  HPI  Patient is a 25 yr old male who presents today with c/o left sided groin pain. He reports that pain began about 4 weeks ago and is intermittent.  Pain radiates into the left inner thigh. Denies testicular pain.  Denies dysuria.    Review of Systems   See HPI  Past Medical History:  Diagnosis Date  . Allergic rhinitis      Social History   Socioeconomic History  . Marital status: Single    Spouse name: Not on file  . Number of children: 0  . Years of education: Not on file  . Highest education level: Not on file  Occupational History  . Occupation:  finished HS  . Occupation: [employer]  Tobacco Use  . Smoking status: Current Every Day Smoker    Types: E-cigarettes  . Smokeless tobacco: Never Used  Substance and Sexual Activity  . Alcohol use: Yes    Comment: weekends   . Drug use: No  . Sexual activity: Not on file  Other Topics Concern  . Not on file  Social History Narrative   Lives w/ g-friend    Social Determinants of Health   Financial Resource Strain: Not on file  Food Insecurity: Not on file  Transportation Needs: Not on file  Physical Activity: Not on file  Stress: Not on file  Social Connections: Not on file  Intimate Partner Violence: Not on file    Past Surgical History:  Procedure Laterality Date  . NO PAST SURGERIES      Family History  Problem Relation Age of Onset  . Hypertension Father   . CAD Neg Hx   . Stroke Neg Hx   . Diabetes Neg Hx   . Cancer Neg Hx     Allergies  Allergen Reactions  . Penicillins     Current Outpatient Medications on File Prior to Visit  Medication Sig Dispense Refill  . chlorhexidine (PERIDEX) 0.12 % solution 15 mLs by Mouth Rinse route 2 (two) times daily.    Marland Kitchen ibuprofen (ADVIL) 600 MG tablet Take 600 mg by mouth 3 (three) times daily.    Marland Kitchen menthol-cetylpyridinium (CEPACOL) 3 MG lozenge Take 1 lozenge by  mouth as needed for sore throat.     No current facility-administered medications on file prior to visit.    BP 134/78 (BP Location: Right Arm, Patient Position: Sitting, Cuff Size: Small)   Pulse 81   Temp 98.4 F (36.9 C) (Oral)   Resp 16   Ht 5\' 6"  (1.676 m)   Wt 154 lb (69.9 kg)   SpO2 100%   BMI 24.86 kg/m        Objective:   Physical Exam Exam conducted with a chaperone present.  Constitutional:      Appearance: Normal appearance.  Abdominal:     Hernia: A hernia is present. Hernia is present in the left inguinal area (hernia appreciated with cough). There is no hernia in the right inguinal area.  Genitourinary:    Pubic Area: No rash.      Penis: Normal and circumcised.      Testes:        Right: Mass, tenderness or swelling not present.        Left: Mass, tenderness or swelling not present.  Neurological:     Mental Status: He is alert.  Assessment & Plan:

## 2020-12-23 NOTE — Assessment & Plan Note (Signed)
Pt requests full panel of testing. See orders.

## 2020-12-23 NOTE — Patient Instructions (Signed)
Please complete lab work prior to leaving. You should be contacted about scheduling your appointment with the surgeon for consultation. Go to the ER if you develop severe/worsening pain.

## 2020-12-23 NOTE — Assessment & Plan Note (Signed)
New. Suspect new left inguinal hernia. Will refer to general surgeon for consultation.

## 2020-12-25 LAB — URINE CYTOLOGY ANCILLARY ONLY
Chlamydia: NEGATIVE
Comment: NEGATIVE
Comment: NEGATIVE
Comment: NORMAL
Neisseria Gonorrhea: NEGATIVE
Trichomonas: NEGATIVE

## 2020-12-30 LAB — RPR+HSVIGM+HBSAG+HSV2(IGG)+...
HIV Screen 4th Generation wRfx: NONREACTIVE
HSV 2 IgG, Type Spec: <0.91 index (ref 0.00–0.90)
HSVI/II Comb IgM: <0.91 Ratio (ref 0.00–0.90)
Hepatitis B Surface Ag: NEGATIVE
RPR Ser Ql: NONREACTIVE

## 2021-10-07 ENCOUNTER — Encounter: Payer: BC Managed Care – PPO | Admitting: Internal Medicine

## 2021-11-03 ENCOUNTER — Encounter: Payer: Self-pay | Admitting: Internal Medicine

## 2021-11-03 ENCOUNTER — Ambulatory Visit (INDEPENDENT_AMBULATORY_CARE_PROVIDER_SITE_OTHER): Payer: Commercial Managed Care - PPO | Admitting: Internal Medicine

## 2021-11-03 VITALS — BP 126/74 | HR 64 | Temp 98.1°F | Resp 16 | Ht 66.0 in | Wt 168.5 lb

## 2021-11-03 DIAGNOSIS — Z0000 Encounter for general adult medical examination without abnormal findings: Secondary | ICD-10-CM

## 2021-11-03 NOTE — Patient Instructions (Signed)
Go to the lab for blood work ? ? ?Is very important you bring the records of your previous vaccines to be sure you are up-to-date. ? ? ?GO TO THE FRONT DESK, PLEASE SCHEDULE YOUR APPOINTMENTS ?Come back for a physical exam in 1 year ? ? ?Testicular Self-Exam ?A self-examination of your testicles (testicular self-exam) involves looking at and feeling your testicles for abnormal lumps or swelling. Several things can cause swelling, lumps, or pain in your testicles. Some of these causes are: ?Injuries. ?Inflammation. ?Infection. ?Buildup of fluids around the testicle (hydrocele). ?Twisted testicles (testicular torsion). ?Testicular cancer. You may be at risk for testicular cancer if you have: ?An undescended testicle (cryptorchidism). ?A history of previous testicular cancer. ?A family history of testicular cancer. ?General tips and recommendations ?The testicles are easiest to examine after a warm bath or shower. They are more difficult to examine when you are cold because the muscles attached to the testicles retract and pull them up higher or into the abdomen. ?A normal testicle is egg-shaped and feels firm. It is smooth and not tender. ?It is normal to feel a firm, spaghetti-like cord at the back of your testicle. This is the spermatic cord. ?How to do a testicular self-exam ? ?Stand and hold your penis away from your body. ?Look at each testicle to check for changes in appearance, such as swelling or changes in size or shape. ?Roll each testicle between your thumb and forefinger, feeling the entire testicle. Feel for: ?Lumps. ?Swelling. ?Discomfort. ?Check the groin area between your abdomen and upper thighs on both sides of your body. Look and feel for any swelling or bumps that are tender. These could be enlarged lymph nodes. ?Contact a health care provider if: ?You find any bumps or lumps, such as a small, hard, pea-sized lump. ?You find swelling, pain, or soreness. ?You see or feel any other changes in your  testicles. ?Summary ?A self-examination of your testicles (testicular self-exam) involves looking at and feeling your testicles for any changes. ?Check each of your testicles for lumps, swelling, or discomfort. These changes can be caused by many things. ?Check for swelling or tender bumps in your groin area between your lower abdomen and upper thighs. ?This information is not intended to replace advice given to you by your health care provider. Make sure you discuss any questions you have with your health care provider. ?Document Revised: 07/02/2019 Document Reviewed: 07/02/2019 ?Elsevier Patient Education ? 2022 Elsevier Inc. ? ?

## 2021-11-03 NOTE — Progress Notes (Signed)
? ?  Subjective:  ? ? Patient ID: Tommy Wilson, male    DOB: [birth year], 26 y.o.   MRN: [id number] ? ?DOS:  11/03/2021 ?Type of visit - description: cpx ? ?In general feels well. ?Has no concerns. ? ? ?Review of Systems ? ? ?A 14 point review of systems is negative  ? ? ?Past Medical History:  ?Diagnosis Date  ? Allergic rhinitis   ? ? ?Past Surgical History:  ?Procedure Laterality Date  ? NO PAST SURGERIES    ? ?Social History  ? ?Socioeconomic History  ? Marital status: Single  ?  Spouse name: Not on file  ? Number of children: 0  ? Years of education: Not on file  ? Highest education level: Not on file  ?Occupational History  ? Occupation:  finished HS  ? Occupation: [employer]  ?Tobacco Use  ? Smoking status: Every Day  ?  Types: E-cigarettes  ? Smokeless tobacco: Never  ?Substance and Sexual Activity  ? Alcohol use: Yes  ?  Comment: weekends   ? Drug use: No  ? Sexual activity: Not on file  ?Other Topics Concern  ? Not on file  ?Social History Narrative  ? Original from [place]  ? Lives w/ g-friend   ? ?Social Determinants of Health  ? ?Financial Resource Strain: Not on file  ?Food Insecurity: Not on file  ?Transportation Needs: Not on file  ?Physical Activity: Not on file  ?Stress: Not on file  ?Social Connections: Not on file  ?Intimate Partner Violence: Not on file  ? ? ?No current outpatient medications ? ? ?   ?Objective:  ? Physical Exam ?BP 126/74 (BP Location: Left Arm, Patient Position: Sitting, Cuff Size: Small)   Pulse 64   Temp 98.1 ?F (36.7 ?C) (Oral)   Resp 16   Ht 5\' 6"  (1.676 m)   Wt 168 lb 8 oz (76.4 kg)   SpO2 97%   BMI 27.20 kg/m?  ?General: ?Well developed, NAD, BMI noted ?Neck: No  thyromegaly  ?HEENT:  ?Normocephalic . Face symmetric, atraumatic ?Lungs:  ?CTA B ?Normal respiratory effort, no intercostal retractions, no accessory muscle use. ?Heart: RRR,  no murmur.  ?Abdomen:  ?Not distended, soft, non-tender. No rebound or rigidity.   ?Lower extremities: no pretibial edema bilaterally   ?Skin: Exposed areas without rash. Not pale. Not jaundice ?Neurologic:  ?alert & oriented X3.  ?Speech normal, gait appropriate for age and unassisted ?Strength symmetric and appropriate for age.  ?Psych: ?Cognition and judgment appear intact.  ?Cooperative with normal attention span and concentration.  ?Behavior appropriate. ?No anxious or depressed appearing. ? ?   ?Assessment   ? ? Assessment: ?Healthy ? ?PLAN ?Here for CPX. ?Doing well. ?RTC 1 year ? ? ?This visit occurred during the SARS-CoV-2 public health emergency.  Safety protocols were in place, including screening questions prior to the visit, additional usage of staff PPE, and extensive cleaning of exam room while observing appropriate contact time as indicated for disinfecting solutions.  ? ?

## 2021-11-04 ENCOUNTER — Encounter: Payer: Self-pay | Admitting: Internal Medicine

## 2021-11-04 LAB — COMPREHENSIVE METABOLIC PANEL
ALT: 13 U/L (ref 0–53)
AST: 18 U/L (ref 0–37)
Albumin: 4.6 g/dL (ref 3.5–5.2)
Alkaline Phosphatase: 86 U/L (ref 39–117)
BUN: 14 mg/dL (ref 6–23)
CO2: 26 mEq/L (ref 19–32)
Calcium: 9.3 mg/dL (ref 8.4–10.5)
Chloride: 103 mEq/L (ref 96–112)
Creatinine, Ser: 0.83 mg/dL (ref 0.40–1.50)
GFR: 121.34 mL/min (ref >60.00)
Glucose, Bld: 62 mg/dL — ABNORMAL LOW (ref 70–99)
Potassium: 4.2 mEq/L (ref 3.5–5.1)
Sodium: 137 mEq/L (ref 135–145)
Total Bilirubin: 0.5 mg/dL (ref 0.2–1.2)
Total Protein: 7.2 g/dL (ref 6.0–8.3)

## 2021-11-04 LAB — LIPID PANEL
Cholesterol: 193 mg/dL (ref 0–200)
HDL: 59.5 mg/dL (ref >39.00)
NonHDL: 133.01
Total CHOL/HDL Ratio: 3
Triglycerides: 339 mg/dL — ABNORMAL HIGH (ref 0.0–149.0)
VLDL: 67.8 mg/dL — ABNORMAL HIGH (ref 0.0–40.0)

## 2021-11-04 LAB — LDL CHOLESTEROL, DIRECT: Direct LDL: 103 mg/dL

## 2021-11-04 NOTE — Assessment & Plan Note (Signed)
Vaccinations: ?Tdap: 2020 ?HPV: 2013, 2014. ?No other vaccines on record, he reports he will send me documentation of shots he had in the last 2 to 3 years due to a immigration process. " had several shots"). ?Covid vax: none, declines  ?Labs: CMP, FLP  ?Patient education: ?-Diet exercise ?-Self testicular exam ?-Information about how to quit tobacco and vaping provided ?

## 2022-03-04 IMAGING — CT CT NECK W/ CM
5 of 6 series · 15 of 33 positions shown, 17 images · IV contrast (APPLIED)
Comparison: None.

CLINICAL DATA: Difficulty swallowing with sore throat

EXAM:
CT NECK WITH CONTRAST
TECHNIQUE: Multidetector CT imaging of the neck was performed using the
standard protocol following the bolus administration of intravenous
contrast.
CONTRAST:  75mL OMNIPAQUE IOHEXOL 300 MG/ML  SOLN

[Series 3: axial neck · axial · 0.52mm/px · z∈[-274,-118]mm · 3 of 158 slices shown, 4 images]
[im 40/158  soft-tissue]
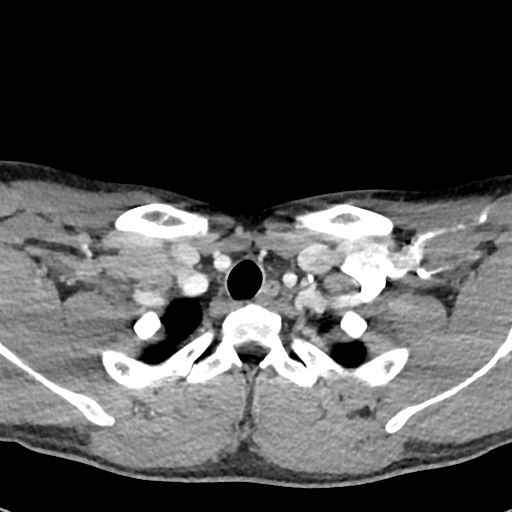
[im 40/158  bone]
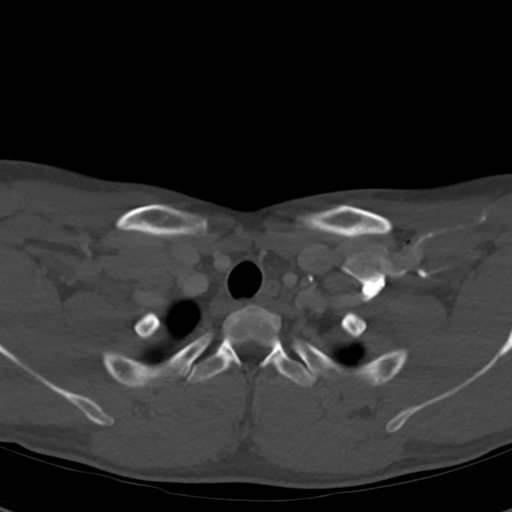
[im 79/158  bone]
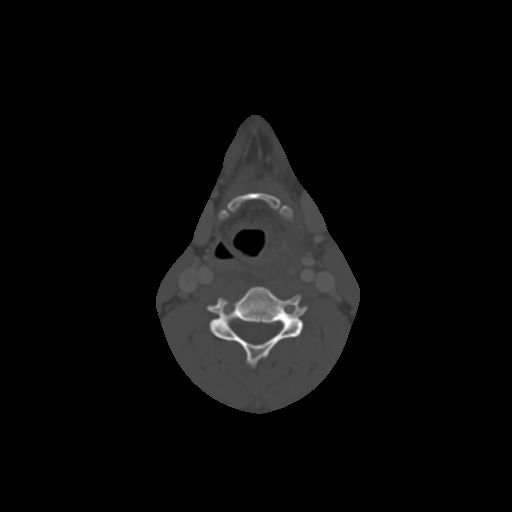
[im 118/158  bone]
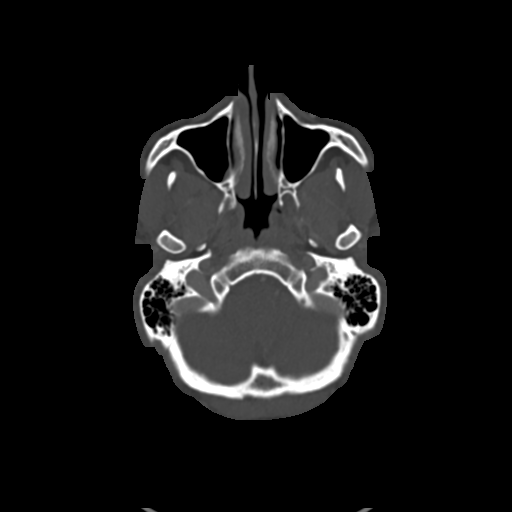

[Series 4: axial bone · axial · 0.52mm/px · z∈[-248,-144]mm · 2 of 158 slices shown]
[im 53/158  bone]
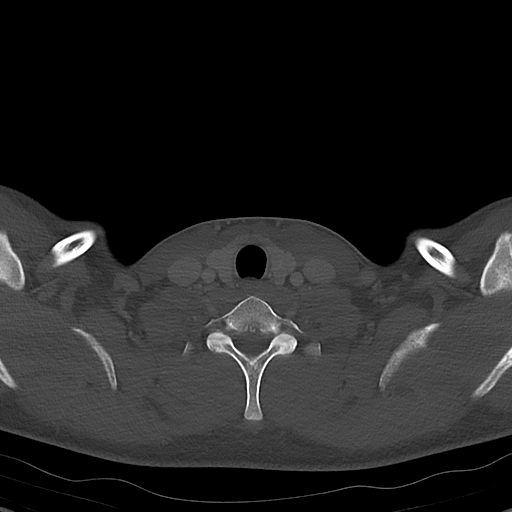
[im 105/158  bone]
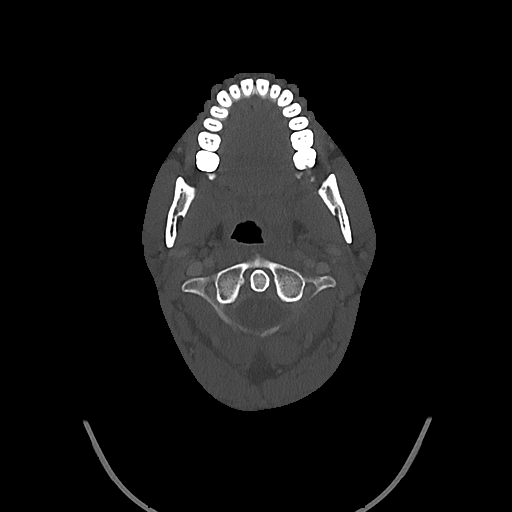

[Series 6: sag neck · sagittal · 0.57mm/px · 5 of 94 slices shown, 6 images]
[im 32/94  bone]
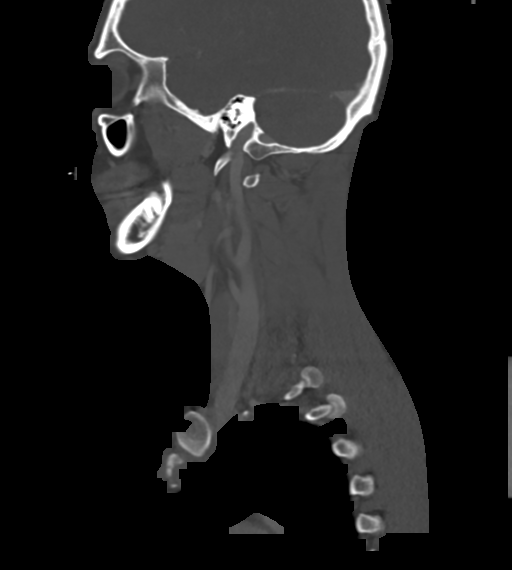
[im 39/94  bone]
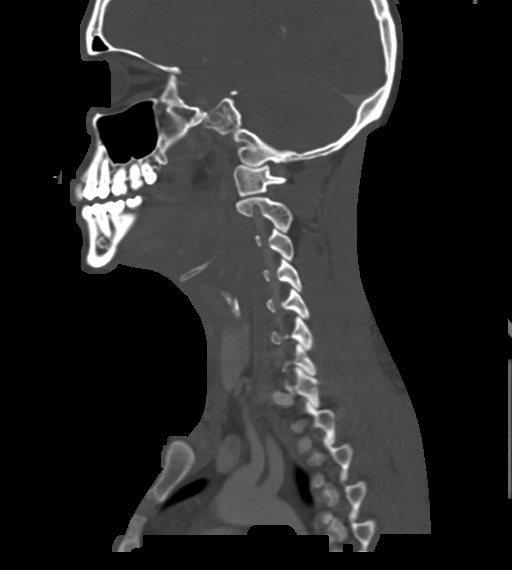
[im 47/94  soft-tissue]
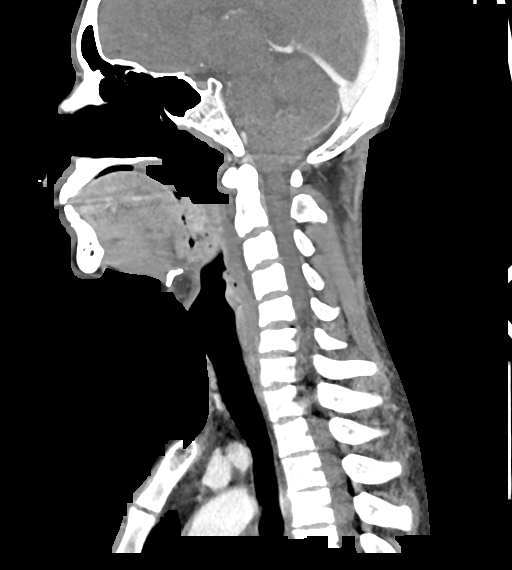
[im 47/94  bone]
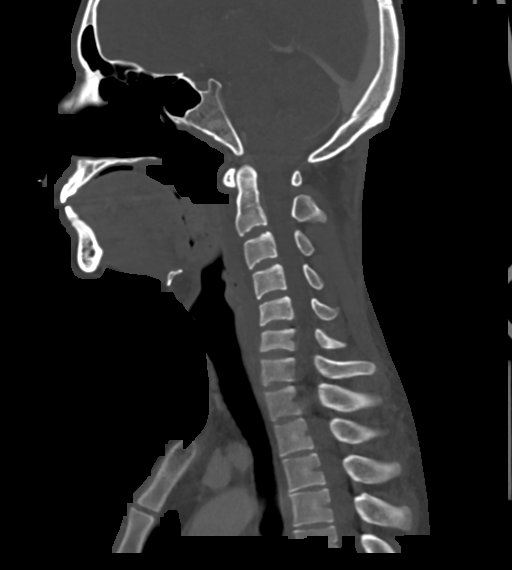
[im 55/94  bone]
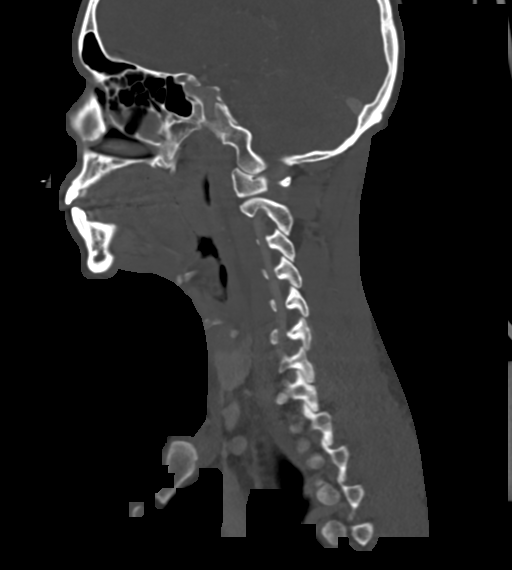
[im 63/94  bone]
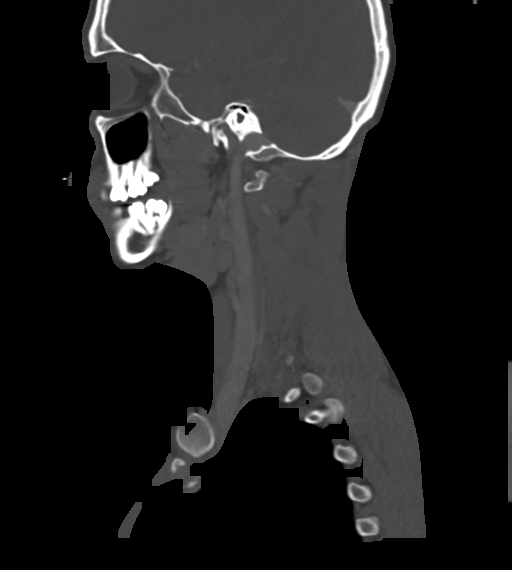

[Series 7: cor neck · coronal · 0.42mm/px · 3 of 130 slices shown]
[im 26/130  bone]
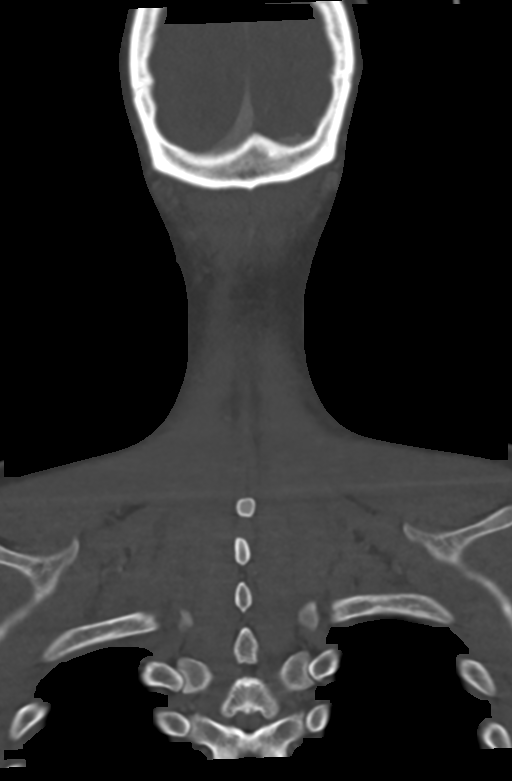
[im 52/130  bone]
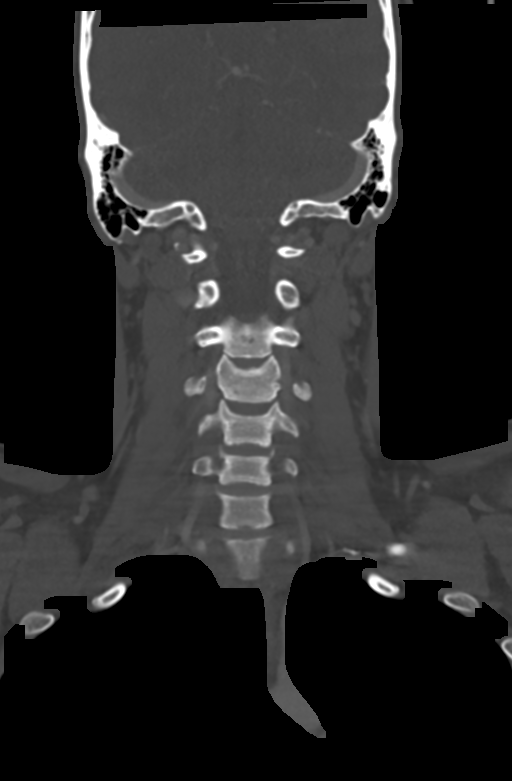
[im 78/130  bone]
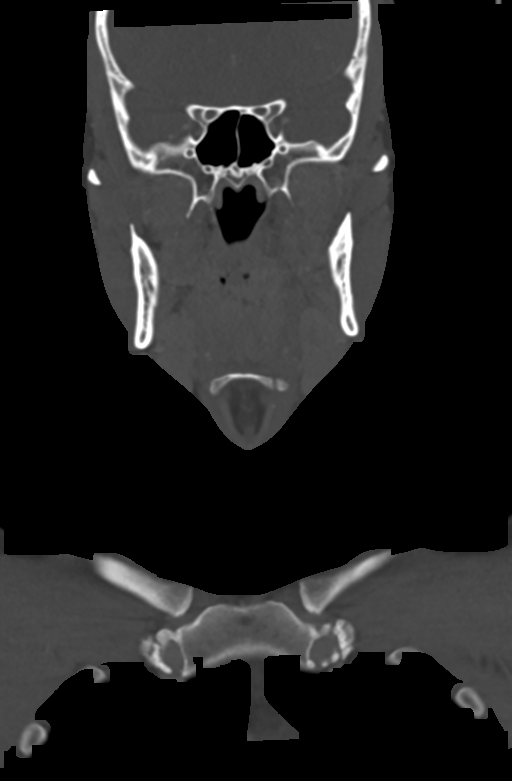

[Series 8: ax oropharynx · axial · 0.48mm/px · z∈[-243,-135]mm · 2 of 163 slices shown]
[im 55/163  bone]
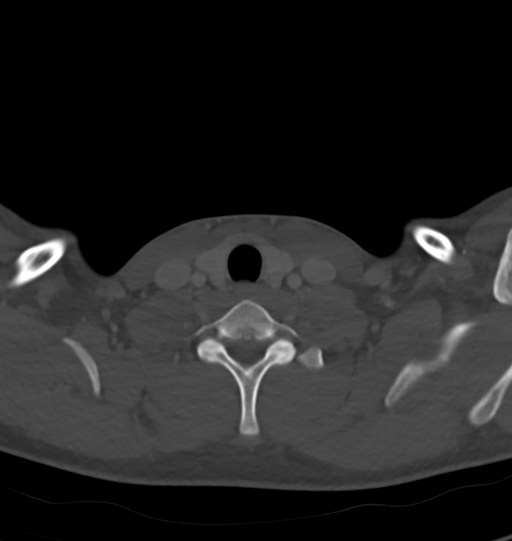
[im 109/163  bone]
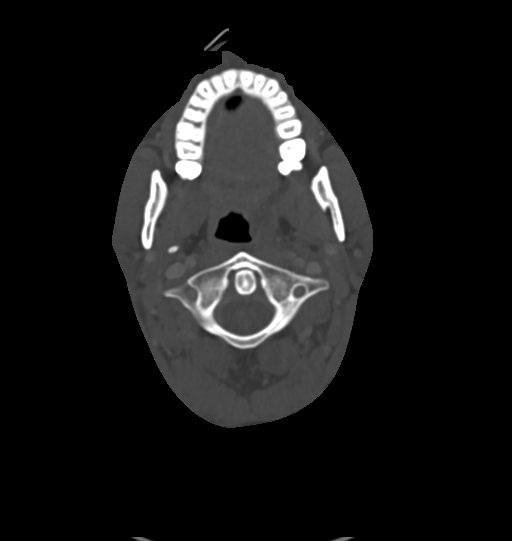

[15 of 33 positions shown; findings below may reference images not displayed]

FINDINGS: PHARYNX AND LARYNX: There is a left palatine peritonsillar abscess
measuring 1.4 x 0.9 cm. There is a large retropharyngeal collection
measuring 5 mm in thickness and extending from C1-C5. The collection
tapers superiorly and inferiorly and is asymmetric to the right

SALIVARY GLANDS: Normal parotid, submandibular and sublingual
glands.

THYROID: Normal.

LYMPH NODES: Multiple bilateral subcentimeter lymph nodes. There is
an enlarged left level 2A node that measures 12 mm.

VASCULAR: Major cervical vessels are patent.

LIMITED INTRACRANIAL: Normal.

VISUALIZED ORBITS: Normal.

MASTOIDS AND VISUALIZED PARANASAL SINUSES: No fluid levels or
advanced mucosal thickening. No mastoid effusion.

SKELETON: No bony spinal canal stenosis. No lytic or blastic
lesions.

UPPER CHEST: Clear.

OTHER: None.
IMPRESSION: 1. Left palatine peritonsillar abscess measuring 1.4 x 0.9 cm with
retropharyngeal edema.
2. Reactive cervical lymphadenopathy.

## 2022-11-05 ENCOUNTER — Encounter: Payer: Commercial Managed Care - PPO | Admitting: Internal Medicine

## 2022-11-08 ENCOUNTER — Encounter: Payer: Self-pay | Admitting: Internal Medicine

## 2022-11-08 ENCOUNTER — Ambulatory Visit (INDEPENDENT_AMBULATORY_CARE_PROVIDER_SITE_OTHER): Payer: Commercial Managed Care - PPO | Admitting: Internal Medicine

## 2022-11-08 VITALS — BP 128/82 | HR 71 | Temp 98.1°F | Resp 16 | Ht 66.0 in | Wt 165.2 lb

## 2022-11-08 DIAGNOSIS — Z0000 Encounter for general adult medical examination without abnormal findings: Secondary | ICD-10-CM

## 2022-11-08 DIAGNOSIS — R7989 Other specified abnormal findings of blood chemistry: Secondary | ICD-10-CM

## 2022-11-08 NOTE — Patient Instructions (Signed)
Please bring the documents regarding previous immunizations (vaccines)   GO TO THE LAB : Get the blood work     [place], [place] back for a physical exam in 1 year

## 2022-11-08 NOTE — Assessment & Plan Note (Signed)
Vaccinations: Tdap: 2020 HPV: 2013, 2014. Again recommend to bring documentation of previous shots. Covid vax: none, advised about the benefits.   Labs: CBC, TSH, free T3, free T4 (at some point TSH was suppressed). Has gained some weight, unable to exercise, busy with work, college and a newborn.  Patient education: self testicular exam Vaping: counseled about quitting

## 2022-11-08 NOTE — Progress Notes (Signed)
   Subjective:    Patient ID: Tommy Wilson, male    DOB: 12/15/1995, 27 y.o.   MRN: [id number]  DOS:  11/08/2022 Type of visit - description: cpx Since the last office visit is doing well, has no concerns or symptoms.   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 [employer]  Tobacco Use   Smoking status: Every Day    Types: E-cigarettes   Smokeless tobacco: Never  Substance and Sexual Activity   Alcohol use: Yes    Comment: weekends    Drug use: No   Sexual activity: Not on file  Other Topics Concern   Not on file  Social History Narrative   Original from [place]   Lives w/ wife   Baby girl born 2023    Social Determinants of Health   Financial Resource Strain: Not on file  Food Insecurity: Not on file  Transportation Needs: Not on file  Physical Activity: Not on file  Stress: Not on file  Social Connections: Not on file  Intimate Partner Violence: Not on file     No current outpatient medications     Objective:   Physical Exam BP 128/82   Pulse 71   Temp 98.1 F (36.7 C) (Oral)   Resp 16   Ht 5\' 6"  (1.676 m)   Wt 165 lb 4 oz (75 kg)   SpO2 98%   BMI 26.67 kg/m  General: Well developed, NAD, BMI noted Neck: No  thyromegaly  HEENT:  Normocephalic . Face symmetric, atraumatic Lungs:  CTA B Normal respiratory effort, no intercostal retractions, no accessory muscle use. Heart: RRR,  no murmur.  Abdomen:  Not distended, soft, non-tender. No rebound or rigidity.   Lower extremities:  no pretibial edema bilaterally  Skin: Exposed areas without rash. Not pale. Not jaundice Neurologic:  alert & oriented X3.  Speech normal, gait appropriate for age and unassisted Strength symmetric and appropriate for age.  Psych: Cognition and judgment appear intact.  Cooperative with normal attention span and concentration.  Behavior appropriate. No anxious or depressed appearing.     Assessment     Assessment: Healthy  PLAN Here for CPX. Doing well. Social: Still working full-time, also  Advertising account planner, to be a Engineer, technical sales.  Has a 70-month-old baby. RTC 1 year

## 2022-11-09 LAB — CBC WITH DIFFERENTIAL/PLATELET
Basophils Absolute: 0.1 10*3/uL (ref 0.0–0.1)
Basophils Relative: 0.9 % (ref 0.0–3.0)
Eosinophils Absolute: 0.2 10*3/uL (ref 0.0–0.7)
Eosinophils Relative: 2.5 % (ref 0.0–5.0)
HCT: 43.6 % (ref 39.0–52.0)
Hemoglobin: 14.7 g/dL (ref 13.0–17.0)
Lymphocytes Relative: 34.5 % (ref 12.0–46.0)
Lymphs Abs: 2.3 10*3/uL (ref 0.7–4.0)
MCHC: 33.7 g/dL (ref 30.0–36.0)
MCV: 91.4 fl (ref 78.0–100.0)
Monocytes Absolute: 0.5 10*3/uL (ref 0.1–1.0)
Monocytes Relative: 7.2 % (ref 3.0–12.0)
Neutro Abs: 3.6 10*3/uL (ref 1.4–7.7)
Neutrophils Relative %: 54.9 % (ref 43.0–77.0)
Platelets: 146 10*3/uL — ABNORMAL LOW (ref 150.0–400.0)
RBC: 4.76 Mil/uL (ref 4.22–5.81)
RDW: 13.6 % (ref 11.5–15.5)
WBC: 6.6 10*3/uL (ref 4.0–10.5)

## 2022-11-09 LAB — T4, FREE: Free T4: 0.76 ng/dL (ref 0.60–1.60)

## 2022-11-09 LAB — T3, FREE: T3, Free: 3.5 pg/mL (ref 2.3–4.2)

## 2022-11-09 LAB — TSH: TSH: 0.73 u[IU]/mL (ref 0.35–5.50)

## 2023-11-11 ENCOUNTER — Encounter: Payer: Commercial Managed Care - PPO | Admitting: Internal Medicine

## 2023-11-11 ENCOUNTER — Encounter: Payer: Self-pay | Admitting: Internal Medicine

## 2023-11-28 ENCOUNTER — Encounter: Payer: Self-pay | Admitting: Internal Medicine

## 2023-11-28 ENCOUNTER — Encounter: Admitting: Internal Medicine

## 2024-04-03 ENCOUNTER — Other Ambulatory Visit (HOSPITAL_COMMUNITY)
Admission: RE | Admit: 2024-04-03 | Discharge: 2024-04-03 | Disposition: A | Discharge status: Home / Self Care | Source: Ambulatory Visit | Attending: Internal Medicine | Admitting: Internal Medicine

## 2024-04-03 ENCOUNTER — Ambulatory Visit (INDEPENDENT_AMBULATORY_CARE_PROVIDER_SITE_OTHER): Payer: Self-pay | Admitting: Internal Medicine

## 2024-04-03 ENCOUNTER — Encounter: Payer: Self-pay | Admitting: Internal Medicine

## 2024-04-03 VITALS — BP 132/80 | HR 74 | Temp 98.3°F | Resp 16 | Ht 66.0 in | Wt 178.2 lb

## 2024-04-03 DIAGNOSIS — Z0000 Encounter for general adult medical examination without abnormal findings: Secondary | ICD-10-CM

## 2024-04-03 DIAGNOSIS — Z1159 Encounter for screening for other viral diseases: Secondary | ICD-10-CM

## 2024-04-03 DIAGNOSIS — Z114 Encounter for screening for human immunodeficiency virus [HIV]: Secondary | ICD-10-CM

## 2024-04-03 DIAGNOSIS — Z113 Encounter for screening for infections with a predominantly sexual mode of transmission: Secondary | ICD-10-CM | POA: Diagnosis not present

## 2024-04-03 NOTE — Progress Notes (Unsigned)
   Subjective:    Patient ID: Tommy Wilson, male    DOB: 12-26-1995, 28 y.o.   MRN: [id number]  DOS:  04/03/2024 Type of visit - description: CPX  Here for CPX. Doing well. Requests STD checks.  Has separated from wife. He is trying to practice safe sex. Denies any penile discharge, dysuria or rash.  Review of Systems See above   Past Medical History:  Diagnosis Date   Allergic rhinitis     Past Surgical History:  Procedure Laterality Date   NO PAST SURGERIES      No current outpatient medications     Objective:   Physical Exam BP 132/80   Pulse 74   Temp 98.3 F (36.8 C)   Resp 16   Ht 5' 6 (1.676 m)   Wt 178 lb 4 oz (80.9 kg)   SpO2 97%   BMI 28.77 kg/m  General: Well developed, NAD, BMI noted Neck: No  thyromegaly  HEENT:  Normocephalic . Face symmetric, atraumatic Lungs:  CTA B Normal respiratory effort, no intercostal retractions, no accessory muscle use. Heart: RRR,  no murmur.  Abdomen:  Not distended, soft, non-tender. No rebound or rigidity.   Lower extremities: no pretibial edema bilaterally  Skin: Exposed areas without rash. Not pale. Not jaundice Neurologic:  alert & oriented X3.  Speech normal, gait appropriate for age and unassisted Strength symmetric and appropriate for age.  Psych: Cognition and judgment appear intact.  Cooperative with normal attention span and concentration.  Behavior appropriate. No anxious or depressed appearing.     Assessment      Assessment: Healthy  PLAN Here for CPX. Tdap: 2020 HPV: 2013, 2014. States that all childhood immunizations through elementary and high school and more vaccines during the immigration process.  Requests STD checks.   Labs: HIV, hep C, RPR, gonorrhea and chlamydia, BMP Vaccine advised: Flu shot and COVID booster. Social: Separated from his wife.  Has been drinking more lately, strongly encouraged moderation.  Also vaping, risks discussed.  Encouraged safe sex. RTC 1  year

## 2024-04-03 NOTE — Patient Instructions (Addendum)
 GO TO THE LAB :  Get the blood work   Your results will be posted on MyChart with my comments  Go to the front desk for the checkout Please make an appointment for a physical exam in 1 year   Testicular Self-Exam A self-examination of your testicles (testicular self-exam) involves looking at and feeling your testicles for abnormal lumps or swelling. Several things can cause swelling, lumps, or pain in your testicles, including: Injuries. Inflammation. Infection. Buildup of fluids around the testicle (hydrocele). Twisted testicles (testicular torsion). Testicular cancer. You may be at risk for this if you have: A testicle that has not descended. Previously had testicular cancer. A family history of testicular cancer. General tips It is easiest to do a self-exam during or after a warm bath or shower. Testicles are harder to examine when you are cold because the muscles attached to the testicles retract and pull them up higher or into the abdomen. A normal testicle is egg-shaped and feels firm. It is smooth and not tender. It is normal to feel a firm, spaghetti-like cord at the back of your testicle. This is the spermatic cord. Do a self-exam once a month. How to do a testicular self-exam  Stand and hold your penis away from your body. Look at each testicle to check for changes in appearance, such as swelling or changes in size or shape. Roll each testicle between your thumb and forefinger, feeling the entire testicle. Feel for: Lumps. Swelling. Discomfort. Check for swelling or tender bumps in the groin area. Your groin is where your lower belly (abdomen) meets your upper thighs. Contact a health care provider if: You find a bump or lump. This may be a small, hard bump that is the size of a pea. You have swelling, pain, or soreness in your testicle area. You see or feel any other changes in your testicles. This information is not intended to replace advice given to you by your  health care provider. Make sure you discuss any questions you have with your health care provider. Document Revised: 05/10/2022 Document Reviewed: 05/10/2022 Elsevier Patient Education  2024 Elsevier Inc.     How to Have Safe Sex Having safe sex means taking steps before and during sex to reduce your risk of: Getting a sexually transmitted infection (STI). Giving your partner an STI. Unwanted or unplanned pregnancy.  How to have safe sex Ways you can have safe sex  Limit your sex partners to only one partner who is only having sex with you. Avoid using alcohol and drugs before having sex. Alcohol and drugs can affect your judgment. Before having sex with a new partner: Talk to your partner about past partners, past STIs, and drug use. Get screened for STIs and discuss the results with your partner. Ask your partner to get screened too. Check your body regularly for sores, blisters, rashes, or unusual discharge. If you notice any of these things, call your health care provider. Avoid sexual contact if you have symptoms of an infection or you're being treated for an STI. While having sex, use a condom. Make sure to: Use a condom every time you have vaginal, oral, or anal sex. Both females and males should wear condoms during oral sex. Do not use a male condom and a male condom at the same time during vaginal sex. Using both types at the same time can cause condoms to break. Keep condoms in place from the beginning to the end of sexual activity. Use a latex  condom, if possible. Latex condoms offer the best protection. Use only water-based lubricants with a condom. Using petroleum-based lubricants or oils will weaken the condom and increase the chance that it will break. Ways your health care provider can help you have safe sex  See your provider for regular screenings, exams, and tests for STIs. Talk with your provider about what kind of birth control is best for you. Get vaccinated  against hepatitis B and human papillomavirus (HPV). If you're at risk of getting human immunodeficiency virus (HIV), talk with your provider about taking a medicine to prevent HIV. You're at risk for HIV if you: Are a male who has sex with other males. Are sexually active with more than one partner. Take drugs by injection. Have a sex partner who has HIV. Have unprotected sex. Have sex with someone who has sex with both males and females. Follow these instructions at home: Take your medicines only as told. Call your provider if you think you might be pregnant. Call your provider if have any symptoms of an infection. Where to find more information Centers for Disease Control and Prevention (CDC): [employer] Office on Women's Health: [employer] This information is not intended to replace advice given to you by your health care provider. Make sure you discuss any questions you have with your health care provider. Document Revised: 12/15/2022 Document Reviewed: 12/15/2022 Elsevier Patient Education  2024 [employer].

## 2024-04-04 ENCOUNTER — Encounter: Payer: Self-pay | Admitting: Internal Medicine

## 2024-04-04 LAB — BASIC METABOLIC PANEL WITH GFR
BUN: 13 mg/dL (ref 6–23)
CO2: 26 meq/L (ref 19–32)
Calcium: 9.1 mg/dL (ref 8.4–10.5)
Chloride: 100 meq/L (ref 96–112)
Creatinine, Ser: 0.97 mg/dL (ref 0.40–1.50)
GFR: 106.41 mL/min (ref >60.00)
Glucose, Bld: 91 mg/dL (ref 70–99)
Potassium: 4.4 meq/L (ref 3.5–5.1)
Sodium: 138 meq/L (ref 135–145)

## 2024-04-04 NOTE — Assessment & Plan Note (Signed)
 Here for CPX. Tdap: 2020 HPV: 2013, 2014. States that all childhood immunizations through elementary and high school and more vaccines during the immigration process.  Requests STD checks.   Labs: HIV, hep C, RPR, gonorrhea and chlamydia, BMP Vaccine advised: Flu shot and COVID booster. Social: Separated from his wife.  Has been drinking more lately, strongly encouraged moderation.  Also vaping, risks discussed.  Encouraged safe sex. RTC 1 year

## 2024-04-05 ENCOUNTER — Ambulatory Visit: Payer: Self-pay | Admitting: Internal Medicine

## 2024-04-05 LAB — HIV ANTIBODY (ROUTINE TESTING W REFLEX): HIV 1&2 Ab, 4th Generation: NONREACTIVE

## 2024-04-05 LAB — HEPATITIS C ANTIBODY: Hepatitis C Ab: NONREACTIVE

## 2024-04-05 LAB — RPR: RPR Ser Ql: NONREACTIVE

## 2024-04-06 LAB — URINE CYTOLOGY ANCILLARY ONLY
Chlamydia: NEGATIVE
Comment: NEGATIVE
Comment: NEGATIVE
Comment: NORMAL
Neisseria Gonorrhea: NEGATIVE
Trichomonas: NEGATIVE

## 2025-04-05 ENCOUNTER — Encounter: Admitting: Internal Medicine
